# Patient Record
Sex: Female | Born: 1965 | Race: White | Hispanic: No | Marital: Married | Smoking: Never smoker
Health system: Southern US, Community
[De-identification: ages and names within clinical notes are randomized; demographics above are authoritative.]

## PROBLEM LIST (undated history)

## (undated) DIAGNOSIS — E079 Disorder of thyroid, unspecified: Secondary | ICD-10-CM

## (undated) HISTORY — PX: BREAST SURGERY: SHX581

## (undated) HISTORY — PX: URETHRAL SLING: SHX2621

---

## 2015-05-05 ENCOUNTER — Encounter (HOSPITAL_BASED_OUTPATIENT_CLINIC_OR_DEPARTMENT_OTHER): Payer: Self-pay | Admitting: Emergency Medicine

## 2015-05-05 ENCOUNTER — Emergency Department (HOSPITAL_BASED_OUTPATIENT_CLINIC_OR_DEPARTMENT_OTHER)
Admission: EM | Admit: 2015-05-05 | Discharge: 2015-05-05 | Disposition: A | Discharge status: Home / Self Care | Payer: BLUE CROSS/BLUE SHIELD | Attending: Emergency Medicine | Admitting: Emergency Medicine

## 2015-05-05 DIAGNOSIS — Z79899 Other long term (current) drug therapy: Secondary | ICD-10-CM | POA: Insufficient documentation

## 2015-05-05 DIAGNOSIS — W1781XA Fall down embankment (hill), initial encounter: Secondary | ICD-10-CM | POA: Diagnosis not present

## 2015-05-05 DIAGNOSIS — Y999 Unspecified external cause status: Secondary | ICD-10-CM | POA: Diagnosis not present

## 2015-05-05 DIAGNOSIS — S8991XA Unspecified injury of right lower leg, initial encounter: Secondary | ICD-10-CM | POA: Insufficient documentation

## 2015-05-05 DIAGNOSIS — Y92828 Other wilderness area as the place of occurrence of the external cause: Secondary | ICD-10-CM | POA: Diagnosis not present

## 2015-05-05 DIAGNOSIS — R52 Pain, unspecified: Secondary | ICD-10-CM

## 2015-05-05 DIAGNOSIS — Y9301 Activity, walking, marching and hiking: Secondary | ICD-10-CM | POA: Insufficient documentation

## 2015-05-05 HISTORY — DX: Disorder of thyroid, unspecified: E07.9

## 2015-05-05 MED ORDER — OXYCODONE-ACETAMINOPHEN 5-325 MG PO TABS
1.0000 | ORAL_TABLET | Freq: Once | ORAL | Status: AC
  Administered 2015-05-05: 1 via ORAL
  Filled 2015-05-05: qty 1

## 2015-05-05 MED ORDER — OXYCODONE-ACETAMINOPHEN 5-325 MG PO TABS: 1.0000 | ORAL_TABLET | ORAL | Status: AC | PRN

## 2015-05-05 MED ORDER — ONDANSETRON 8 MG PO TBDP
8.0000 mg | ORAL_TABLET | Freq: Once | ORAL | Status: AC
  Administered 2015-05-05: 8 mg via ORAL
  Filled 2015-05-05: qty 1

## 2015-05-05 MED ORDER — NAPROXEN 500 MG PO TABS: 500.0000 mg | ORAL_TABLET | Freq: Two times a day (BID) | ORAL | Status: AC

## 2015-05-05 MED ORDER — ONDANSETRON 8 MG PO TBDP: 8.0000 mg | ORAL_TABLET | Freq: Three times a day (TID) | ORAL | Status: AC | PRN

## 2015-05-05 NOTE — Discharge Instructions (Signed)

## 2015-05-05 NOTE — ED Notes (Signed)
Hyperflexed right ankle, "pulling something" in right calf.  Severe pain and tenderness.  Unable to bear weight.

## 2015-05-05 NOTE — ED Provider Notes (Signed)
CSN: [id number]     Arrival date & time 05/05/15  2050 History   First MD Initiated Contact with Patient 05/05/15 2155     Chief Complaint  Patient presents with  . Leg Injury     (Consider location/radiation/quality/duration/timing/severity/associated sxs/prior Treatment) HPI Comments: Patient here with complaint of sudden onset of right calf pain after a hyperextension injury 2 hours prior to arrival. She was walking up a hill, right foot in dorsiflexion with incline of hill and fell forward, causing hyperextension. She felt a "pop" at the proximal posterior lower leg with pain since. No discoloration. No distal LE pain. She reports being unable to bear weight.   The history is provided by the patient. No language interpreter was used.    Past Medical History  Diagnosis Date  . Thyroid disease    Past Surgical History  Procedure Laterality Date  . Breast surgery    . Urethral sling     No family history on file. Social History  Substance Use Topics  . Smoking status: Never Smoker   . Smokeless tobacco: None  . Alcohol Use: Yes     Comment: occ   OB History    No data available     Review of Systems  Musculoskeletal:       See HPI.  Skin: Negative for color change.  Neurological: Negative for numbness.      Allergies  Shellfish allergy  Home Medications   Prior to Admission medications   Medication Sig Start Date End Date Taking? Authorizing Provider  levothyroxine (LEVOXYL) 137 MCG tablet Take 137 mcg by mouth daily before breakfast.   Yes Historical Provider, MD   BP 146/79 mmHg  Pulse 78  Temp(Src) 98.7 F (37.1 C) (Oral)  Resp 16  Ht 5\' 6"  (1.676 m)  Wt 87.544 kg  BMI 31.17 kg/m2  SpO2 98%  LMP 04/21/2015 (Exact Date) Physical Exam  Constitutional: She appears well-developed and well-nourished. No distress.  Cardiovascular: Intact distal pulses.   Musculoskeletal:  Right lower leg tender, tight, without discoloration. Distal Achilles intact.  Distal pulses intact. She has preserved plantar and dorsiflexion of foot and flexion/extension of knee. No thigh tenderness.   Skin: Skin is warm and dry. No erythema.  Psychiatric: She has a normal mood and affect.    ED Course  Procedures (including critical care time) Labs Review Labs Reviewed - No data to display  Imaging Review No results found. I have personally reviewed and evaluated these images and lab results as part of my medical decision-making.   EKG Interpretation None      MDM   Final diagnoses:  None    1. Hyperextension injury right leg  Presents with pain and swelling after hyperextension injury right LE. She can flex and extend, still consider rupture of gastroc. She reports she is driving to [place] in the morning. Will provide anti-inflammatories, cool compresses. Opt not to immobilize. Discussed ss/sxs compartment syndrome and need for close follow up. Discussed stopping frequently on drive to [place] tomorrow to prevent DVT. Discussed with Dr. Dalene SeltzerSchlossman who is involved in patient care planning.  11:20 - on discharge the patient states she has decided not to go to [place] tomorrow and requests imaging to determine degree of injury. Outpatient [facility] the lower [facility] Orthopedic referral provided for Monday follow up.   Elpidio AnisShari Teagen Mcleary, PA-C 05/05/15 2305  Elpidio AnisShari Mckaila Duffus, PA-C 05/05/15 [id number]  Alvira MondayErin Schlossman, MD 05/07/15 1427

## 2015-05-06 ENCOUNTER — Ambulatory Visit (HOSPITAL_BASED_OUTPATIENT_CLINIC_OR_DEPARTMENT_OTHER)
Admission: RE | Admit: 2015-05-06 | Discharge: 2015-05-06 | Disposition: A | Discharge status: Home / Self Care | Payer: BLUE CROSS/BLUE SHIELD | Source: Ambulatory Visit | Attending: Emergency Medicine | Admitting: Emergency Medicine

## 2015-05-06 DIAGNOSIS — R600 Localized edema: Secondary | ICD-10-CM | POA: Diagnosis not present

## 2015-05-06 DIAGNOSIS — R52 Pain, unspecified: Secondary | ICD-10-CM | POA: Diagnosis not present

## 2017-07-26 IMAGING — MR MR [PERSON_NAME] LOW W/O CM*R*
4 of 6 series · 26 of 40 positions shown · non-contrast
Comparison: None.

CLINICAL DATA: Right calf pain and limited weight-bearing after
injury last night. Evaluate for muscle or tendon rupture.

EXAM:
MRI OF LOWER RIGHT EXTREMITY WITHOUT CONTRAST
TECHNIQUE: Multiplanar, multisequence MR imaging of the right lower leg was
performed. No intravenous contrast was administered.

[Series 10: T1 · coronal · 4.0mm · 0.78mm/px · 6 of 28 slices shown (1 of 3)]
[im 1/28]
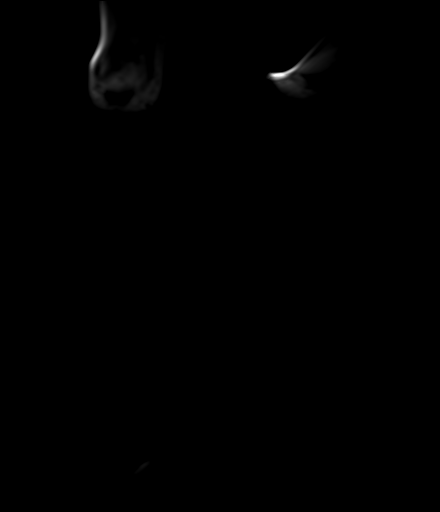
[im 6/28]
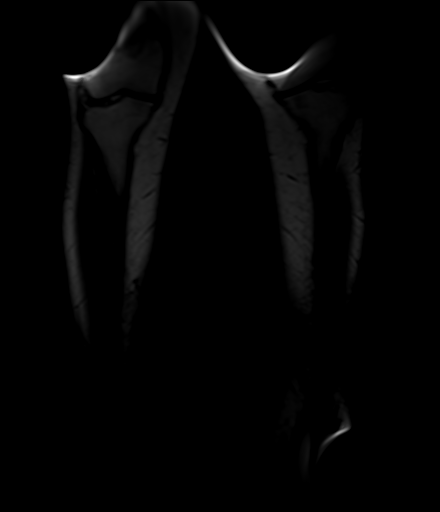
[im 11/28]
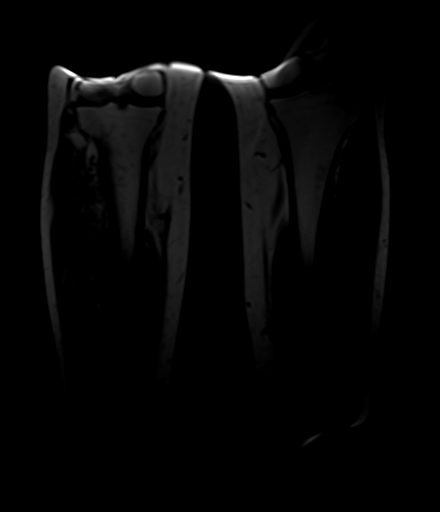
[im 17/28]
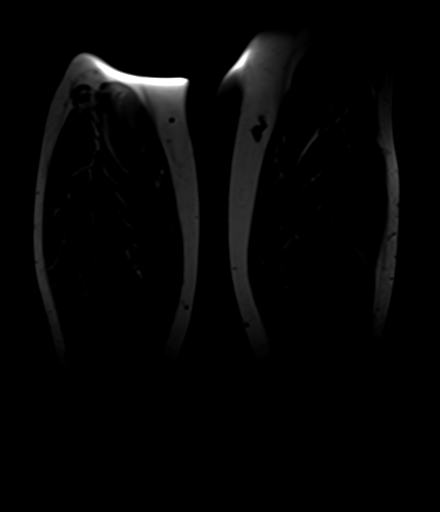
[im 22/28]
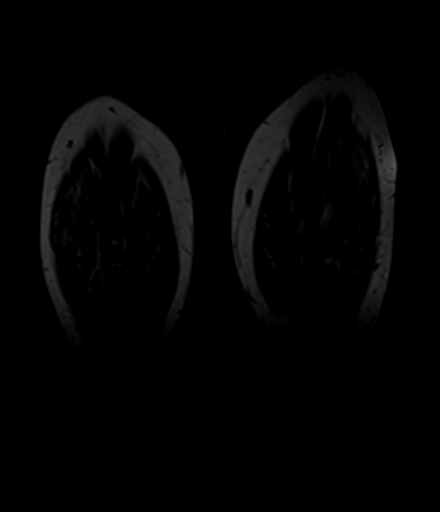
[im 28/28]
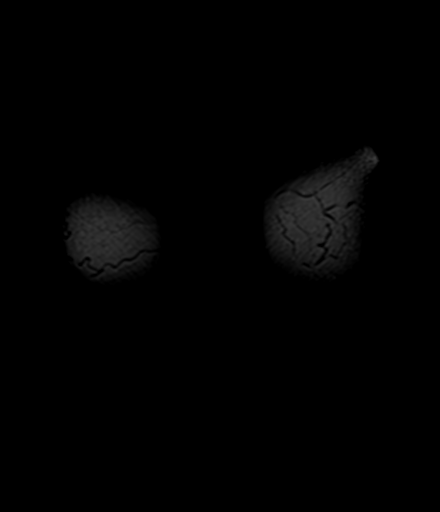

[Series 11: T2 fat-sat · axial · 6.0mm · 0.66mm/px · z∈[-128,+176]mm · 9 of 40 slices shown]
[im 1/40]
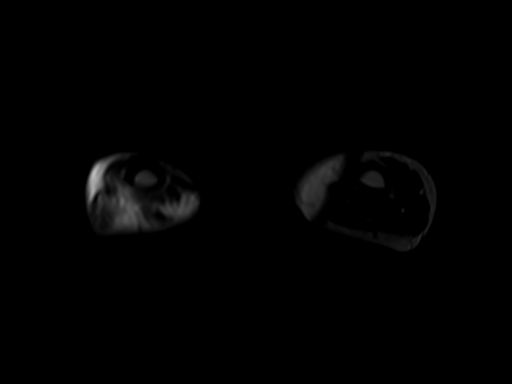
[im 5/40]
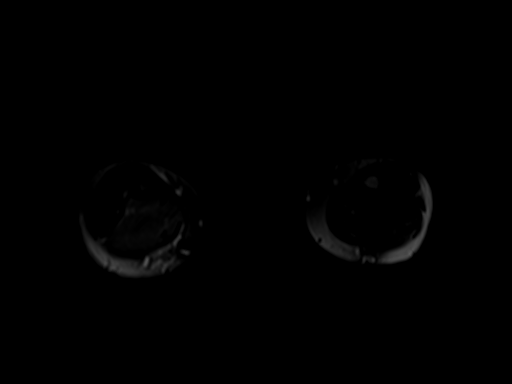
[im 10/40]
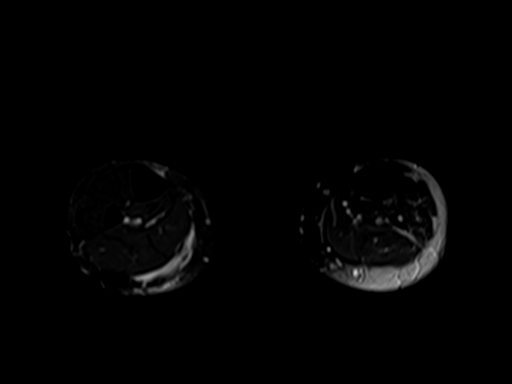
[im 15/40]
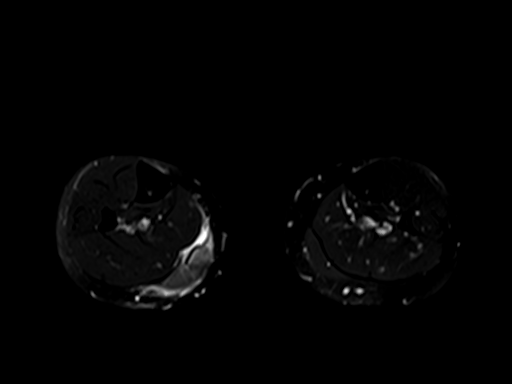
[im 20/40]
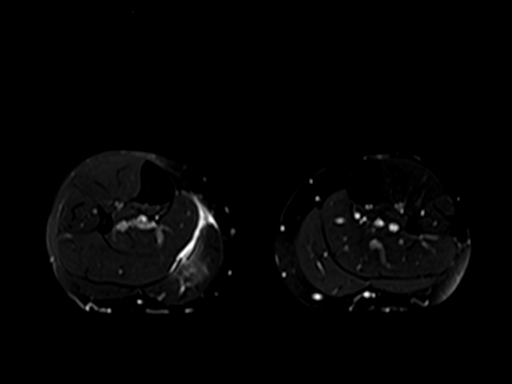
[im 25/40]
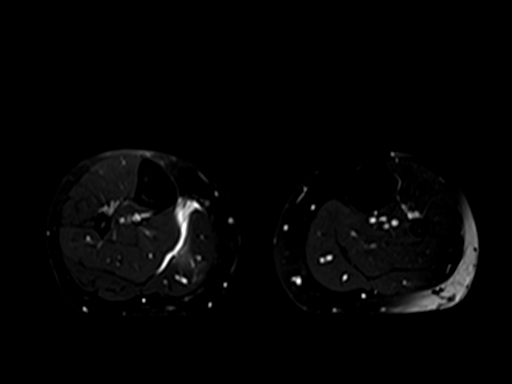
[im 30/40]
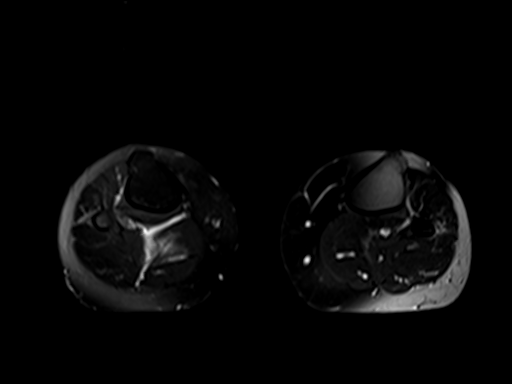
[im 35/40]
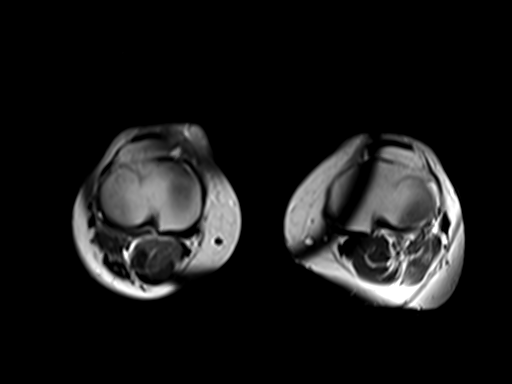
[im 40/40]
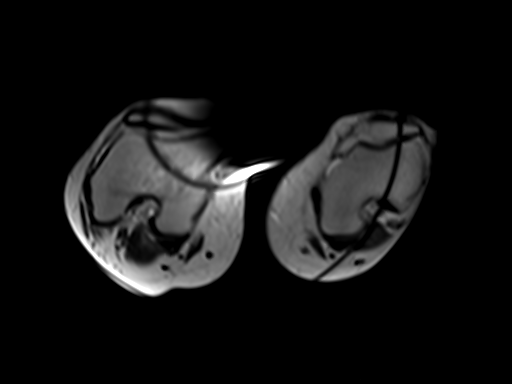

[Series 12: T1 · axial · 6.0mm · 0.62mm/px · z∈[-128,+138]mm · 8 of 40 slices shown (2 of 3)]
[im 1/40]
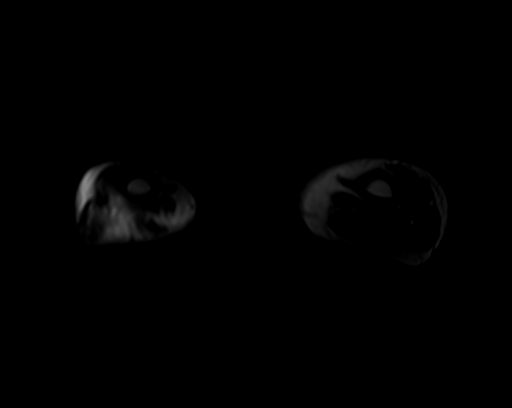
[im 5/40]
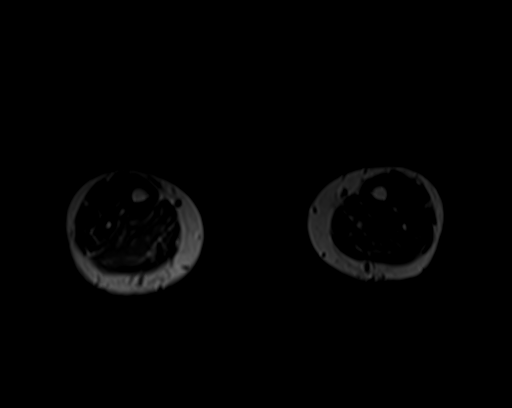
[im 10/40]
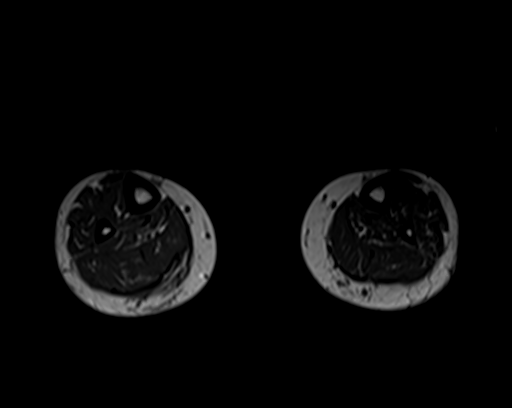
[im 15/40]
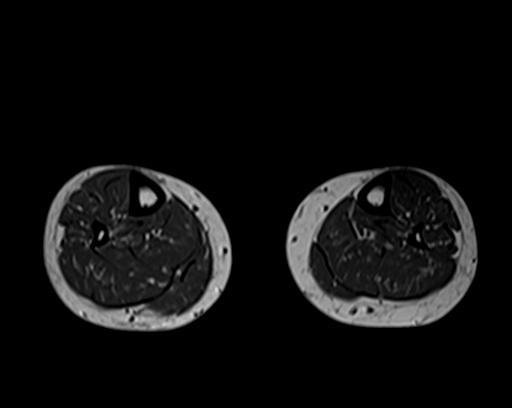
[im 20/40]
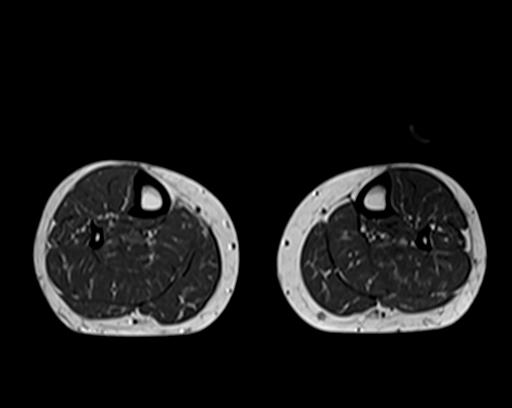
[im 25/40]
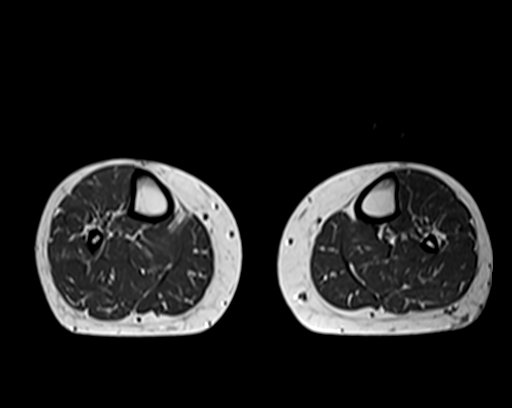
[im 30/40]
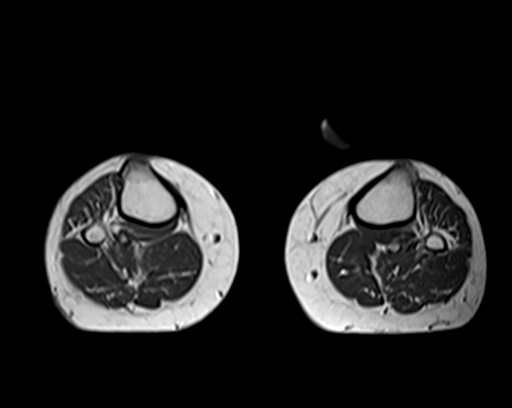
[im 35/40]
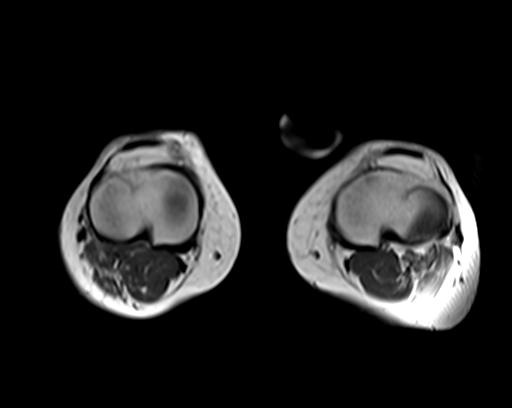

[Series 13: T1 · sagittal · 4.0mm · 1.12mm/px · 3 of 28 slices shown (3 of 3)]
[im 6/28]
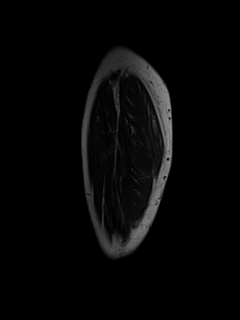
[im 17/28]
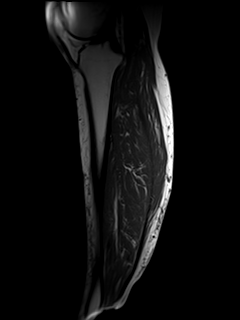
[im 28/28]
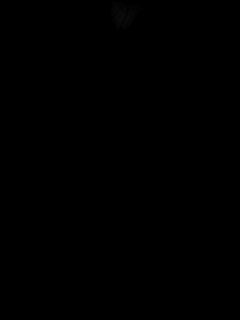

[26 of 40 positions shown; findings below may reference images not displayed]

FINDINGS: Study was performed using the body coil. Both lower legs are
included on the axial and coronal images.

There is no evidence of acute fracture or dislocation. There is some
distortion of the proximal and distal anatomy by artifact.

There is irregular fluid tracking between the medial head of the
gastrocnemius and the soleus muscles on the right. There is mild
edema within the medial head of the gastrocnemius muscle. The
visualized Achilles tendon appears normal. No vascular abnormalities
are seen.

No significant abnormalities are seen within the visualize left
lower leg.
IMPRESSION: Fluid collection in the proximal right calf typical of plantaris
tendon rupture. Mild adjacent edema in the medial head of the
gastrocnemius muscle.
